# Patient Record
Sex: Male | Born: 1975 | Race: White | Hispanic: No | Marital: Married | Smoking: Never smoker
Health system: Southern US, Community
[De-identification: ages and names within clinical notes are randomized; demographics above are authoritative.]

## PROBLEM LIST (undated history)

## (undated) DIAGNOSIS — G43909 Migraine, unspecified, not intractable, without status migrainosus: Secondary | ICD-10-CM

## (undated) DIAGNOSIS — F419 Anxiety disorder, unspecified: Secondary | ICD-10-CM

## (undated) DIAGNOSIS — E7800 Pure hypercholesterolemia, unspecified: Secondary | ICD-10-CM

## (undated) DIAGNOSIS — N200 Calculus of kidney: Secondary | ICD-10-CM

## (undated) HISTORY — PX: KNEE SURGERY: SHX244

---

## 1999-12-23 ENCOUNTER — Ambulatory Visit (HOSPITAL_COMMUNITY): Admission: RE | Admit: 1999-12-23 | Discharge: 1999-12-23 | Payer: Self-pay | Admitting: Family Medicine

## 1999-12-23 ENCOUNTER — Encounter: Payer: Self-pay | Admitting: Family Medicine

## 2000-01-31 ENCOUNTER — Encounter: Payer: Self-pay | Admitting: Family Medicine

## 2000-01-31 ENCOUNTER — Ambulatory Visit (HOSPITAL_COMMUNITY): Admission: RE | Admit: 2000-01-31 | Discharge: 2000-01-31 | Payer: Self-pay | Admitting: Family Medicine

## 2002-07-05 ENCOUNTER — Encounter: Payer: Self-pay | Admitting: Emergency Medicine

## 2002-07-05 ENCOUNTER — Emergency Department (HOSPITAL_COMMUNITY): Admission: EM | Admit: 2002-07-05 | Discharge: 2002-07-06 | Payer: Self-pay | Admitting: Emergency Medicine

## 2008-05-28 ENCOUNTER — Encounter: Admission: RE | Admit: 2008-05-28 | Discharge: 2008-05-28 | Payer: Self-pay | Admitting: Family Medicine

## 2014-07-30 ENCOUNTER — Emergency Department (HOSPITAL_BASED_OUTPATIENT_CLINIC_OR_DEPARTMENT_OTHER): Payer: BC Managed Care – PPO

## 2014-07-30 ENCOUNTER — Encounter (HOSPITAL_BASED_OUTPATIENT_CLINIC_OR_DEPARTMENT_OTHER): Payer: Self-pay | Admitting: *Deleted

## 2014-07-30 ENCOUNTER — Emergency Department (HOSPITAL_BASED_OUTPATIENT_CLINIC_OR_DEPARTMENT_OTHER)
Admission: EM | Admit: 2014-07-30 | Discharge: 2014-07-31 | Disposition: A | Discharge status: Home / Self Care | Payer: BC Managed Care – PPO | Attending: Emergency Medicine | Admitting: Emergency Medicine

## 2014-07-30 DIAGNOSIS — E780 Pure hypercholesterolemia: Secondary | ICD-10-CM | POA: Diagnosis not present

## 2014-07-30 DIAGNOSIS — Z8679 Personal history of other diseases of the circulatory system: Secondary | ICD-10-CM | POA: Diagnosis not present

## 2014-07-30 DIAGNOSIS — F419 Anxiety disorder, unspecified: Secondary | ICD-10-CM | POA: Diagnosis not present

## 2014-07-30 DIAGNOSIS — N200 Calculus of kidney: Secondary | ICD-10-CM

## 2014-07-30 DIAGNOSIS — R109 Unspecified abdominal pain: Secondary | ICD-10-CM | POA: Diagnosis present

## 2014-07-30 HISTORY — DX: Calculus of kidney: N20.0

## 2014-07-30 HISTORY — DX: Pure hypercholesterolemia, unspecified: E78.00

## 2014-07-30 HISTORY — DX: Anxiety disorder, unspecified: F41.9

## 2014-07-30 HISTORY — DX: Migraine, unspecified, not intractable, without status migrainosus: G43.909

## 2014-07-30 LAB — CBC WITH DIFFERENTIAL/PLATELET
BASOS ABS: 0.1 10*3/uL (ref 0.0–0.1)
Basophils Relative: 1 % (ref 0–1)
EOS ABS: 0.3 10*3/uL (ref 0.0–0.7)
EOS PCT: 3 % (ref 0–5)
HEMATOCRIT: 41.5 % (ref 39.0–52.0)
Hemoglobin: 13.9 g/dL (ref 13.0–17.0)
LYMPHS ABS: 2.9 10*3/uL (ref 0.7–4.0)
Lymphocytes Relative: 32 % (ref 12–46)
MCH: 29.4 pg (ref 26.0–34.0)
MCHC: 33.5 g/dL (ref 30.0–36.0)
MCV: 87.9 fL (ref 78.0–100.0)
MONOS PCT: 9 % (ref 3–12)
Monocytes Absolute: 0.8 10*3/uL (ref 0.1–1.0)
Neutro Abs: 5.2 10*3/uL (ref 1.7–7.7)
Neutrophils Relative %: 55 % (ref 43–77)
Platelets: 232 10*3/uL (ref 150–400)
RBC: 4.72 MIL/uL (ref 4.22–5.81)
RDW: 13.3 % (ref 11.5–15.5)
WBC: 9.2 10*3/uL (ref 4.0–10.5)

## 2014-07-30 LAB — BASIC METABOLIC PANEL
Anion gap: 10 (ref 5–15)
BUN: 22 mg/dL — ABNORMAL HIGH (ref 6–20)
CALCIUM: 9.3 mg/dL (ref 8.9–10.3)
CHLORIDE: 108 mmol/L (ref 101–111)
CO2: 21 mmol/L — ABNORMAL LOW (ref 22–32)
CREATININE: 1.62 mg/dL — AB (ref 0.61–1.24)
GFR, EST AFRICAN AMERICAN: 59 mL/min — AB (ref >60)
GFR, EST NON AFRICAN AMERICAN: 51 mL/min — AB (ref >60)
Glucose, Bld: 141 mg/dL — ABNORMAL HIGH (ref 65–99)
Potassium: 3.9 mmol/L (ref 3.5–5.1)
Sodium: 139 mmol/L (ref 135–145)

## 2014-07-30 LAB — URINALYSIS, ROUTINE W REFLEX MICROSCOPIC
BILIRUBIN URINE: NEGATIVE
Glucose, UA: NEGATIVE mg/dL
Ketones, ur: NEGATIVE mg/dL
Leukocytes, UA: NEGATIVE
Nitrite: NEGATIVE
Protein, ur: NEGATIVE mg/dL
Specific Gravity, Urine: 1.021 (ref 1.005–1.030)
UROBILINOGEN UA: 0.2 mg/dL (ref 0.0–1.0)
pH: 5 (ref 5.0–8.0)

## 2014-07-30 LAB — URINE MICROSCOPIC-ADD ON

## 2014-07-30 MED ORDER — ONDANSETRON 4 MG PO TBDP: 4.0000 mg | ORAL_TABLET | Freq: Three times a day (TID) | ORAL | Status: AC | PRN

## 2014-07-30 MED ORDER — TAMSULOSIN HCL 0.4 MG PO CAPS: 0.4000 mg | ORAL_CAPSULE | Freq: Every day | ORAL | Status: AC

## 2014-07-30 MED ORDER — OXYCODONE-ACETAMINOPHEN 5-325 MG PO TABS
1.0000 | ORAL_TABLET | Freq: Once | ORAL | Status: AC
  Administered 2014-07-31: 1 via ORAL
  Filled 2014-07-30: qty 1

## 2014-07-30 MED ORDER — OXYCODONE-ACETAMINOPHEN 5-325 MG PO TABS: 1.0000 | ORAL_TABLET | Freq: Four times a day (QID) | ORAL | Status: AC | PRN

## 2014-07-30 MED ORDER — FENTANYL CITRATE (PF) 100 MCG/2ML IJ SOLN
50.0000 ug | Freq: Once | INTRAMUSCULAR | Status: AC
  Administered 2014-07-30: 50 ug via INTRAVENOUS
  Filled 2014-07-30: qty 2

## 2014-07-30 MED ORDER — SODIUM CHLORIDE 0.9 % IV BOLUS (SEPSIS)
1000.0000 mL | INTRAVENOUS | Status: AC
  Administered 2014-07-30: 1000 mL via INTRAVENOUS

## 2014-07-30 MED ORDER — KETOROLAC TROMETHAMINE 30 MG/ML IJ SOLN
30.0000 mg | Freq: Once | INTRAMUSCULAR | Status: AC
  Administered 2014-07-30: 30 mg via INTRAVENOUS
  Filled 2014-07-30: qty 1

## 2014-07-30 MED ORDER — ONDANSETRON HCL 4 MG/2ML IJ SOLN
4.0000 mg | Freq: Once | INTRAMUSCULAR | Status: AC
  Administered 2014-07-30: 4 mg via INTRAVENOUS
  Filled 2014-07-30: qty 2

## 2014-07-30 MED ORDER — HYDROMORPHONE HCL 1 MG/ML IJ SOLN
1.0000 mg | Freq: Once | INTRAMUSCULAR | Status: AC
  Administered 2014-07-30: 1 mg via INTRAVENOUS
  Filled 2014-07-30: qty 1

## 2014-07-30 NOTE — ED Notes (Signed)
Patient transported to CT 

## 2014-07-30 NOTE — ED Notes (Signed)
Left flank pain since earlier today. Hx of kidney stones x 2.

## 2014-07-30 NOTE — ED Notes (Signed)
MD at bedside. 

## 2014-07-30 NOTE — ED Provider Notes (Signed)
CSN: [id number]     Arrival date & time 07/30/14  2029 History  This chart was scribed for Purvis Sheffield, MD by Doreatha Martin, ED Scribe. This patient was seen in room MH09/MH09 and the patient's care was started at 9:24 PM.      Chief Complaint  Patient presents with  . Flank Pain   Patient is a 39 y.o. male presenting with flank pain. The history is provided by the patient. No language interpreter was used.  Flank Pain This is a recurrent problem. The current episode started 3 to 5 hours ago. The problem occurs constantly. The problem has been gradually worsening. Pertinent negatives include no chest pain, no abdominal pain and no headaches. Nothing aggravates the symptoms. Nothing relieves the symptoms. He has tried nothing for the symptoms. The treatment provided no relief.    HPI Comments: Joseph Abbott is a 39 y.o. male with Hx of kidney stones who presents to the Emergency Department complaining of moderate, gradually worsening, sudden onset, left-sided flank 9/10 pain that radiates to the groin onset this afternoon. Pt reports associated diaphoresis, urgency, difficulty urinating, nausea, vomiting. Pt states that this pain feels similar to his previous episodes of renal calculi. Pt followed up with a urologist after his first episode of kidney stones 12 years ago. Pt states that his second episode was during the day and he was able to get a shot to relieve the pain in the office. NKDA. He denies fever, dysuria.   Past Medical History  Diagnosis Date  . Kidney stones   . Migraine   . High cholesterol   . Anxiety    Past Surgical History  Procedure Laterality Date  . Knee surgery     No family history on file. History  Substance Use Topics  . Smoking status: Never Smoker   . Smokeless tobacco: Not on file  . Alcohol Use: No    Review of Systems  Constitutional: Negative for appetite change and fatigue.  HENT: Negative for congestion, ear discharge and sinus pressure.    Eyes: Negative for discharge.  Respiratory: Negative for cough.   Cardiovascular: Negative for chest pain.  Gastrointestinal: Negative for abdominal pain and diarrhea.  Genitourinary: Positive for flank pain. Negative for frequency and hematuria.  Musculoskeletal: Negative for back pain.  Skin: Negative for rash.  Neurological: Negative for seizures and headaches.  Psychiatric/Behavioral: Negative for hallucinations.  All other systems reviewed and are negative.  Allergies  Review of patient's allergies indicates no known allergies.  Home Medications   Prior to Admission medications   Medication Sig Start Date End Date Taking? Authorizing Provider  ATENOLOL PO Take by mouth.   Yes Historical Provider, MD  Atorvastatin Calcium (LIPITOR PO) Take by mouth.   Yes Historical Provider, MD  Escitalopram Oxalate (LEXAPRO PO) Take by mouth.   Yes Historical Provider, MD  Topiramate (TOPAMAX PO) Take by mouth.   Yes Historical Provider, MD   BP 141/82 mmHg  Pulse 66  Temp(Src) 98.4 F (36.9 C) (Oral)  Resp 16  Ht  (1.854 m)  Wt 250 lb (113.399 kg)  BMI 32.99 kg/m2  SpO2 100% Physical Exam  Constitutional: He is oriented to person, place, and time. He appears well-developed and well-nourished. No distress.  HENT:  Head: Normocephalic and atraumatic.  Mouth/Throat: Oropharynx is clear and moist.  Eyes: Conjunctivae and EOM are normal. Pupils are equal, round, and reactive to light.  Neck: Normal range of motion. Neck supple. No tracheal deviation present.  Cardiovascular: Normal rate and normal heart sounds.  Exam reveals no gallop and no friction rub.   No murmur heard. Pulmonary/Chest: Breath sounds normal. No respiratory distress.  Abdominal: Soft. He exhibits no distension. There is no tenderness. There is no rebound and no guarding.  Musculoskeletal: Normal range of motion. He exhibits no edema or tenderness.  Neurological: He is alert and oriented to person, place, and  time.  Skin: Skin is warm and dry.  Psychiatric: He has a normal mood and affect. His behavior is normal.  Nursing note and vitals reviewed.   ED Course  Procedures (including critical care time) DIAGNOSTIC STUDIES: Oxygen Saturation is 100% on RA, normal by my interpretation.    COORDINATION OF CARE: 9:29 PM Discussed treatment plan with pt at bedside and pt agreed to plan.   Labs Review Labs Reviewed  URINALYSIS, ROUTINE W REFLEX MICROSCOPIC (NOT AT [facility]) - Abnormal; Notable for the following:    Hgb urine dipstick LARGE (*)    All other components within normal limits  BASIC METABOLIC PANEL - Abnormal; Notable for the following:    CO2 21 (*)    Glucose, Bld 141 (*)    BUN 22 (*)    Creatinine, Ser 1.62 (*)    GFR calc non Af Amer 51 (*)    GFR calc Af Amer 59 (*)    All other components within normal limits  URINE MICROSCOPIC-ADD ON  CBC WITH DIFFERENTIAL/PLATELET    Imaging Review Ct Renal Stone Study  07/30/2014   CLINICAL DATA:  Left flank pain, nausea and hematuria.  EXAM: CT ABDOMEN AND PELVIS WITHOUT CONTRAST  TECHNIQUE: Multidetector CT imaging of the abdomen and pelvis was performed following the standard protocol without IV contrast.  COMPARISON:  05/29/2014  FINDINGS: There is a 2 mm distal left ureteral calculus 1 cm from the ureterovesical junction with associated hydroureter and hydronephrosis. There also are at least two 2 mm calculi in the right lower pole renal collecting system.  There are unremarkable unenhanced appearances of the liver, spleen, pancreas and adrenals. The abdominal aorta is normal in caliber without atherosclerotic calcification. Bowel is remarkable only for uncomplicated colonic diverticulosis.  There is no significant abnormality in the lower chest. There is no significant musculoskeletal abnormality. Incidental note is made of a small fat containing umbilical hernia and small fat containing left inguinal hernia.  IMPRESSION: 1. Obstructing 2  mm distal left ureteral calculus, 1 cm from the UVJ. Moderate hydronephrosis. 2. Right nephrolithiasis 3. Diverticulosis 4. Small fat containing umbilical hernia 5. Small fat containing left inguinal hernia   Electronically Signed   By: Ellery Plunk M.D.   On: 07/30/2014 23:35     EKG Interpretation None      MDM   Final diagnoses:  Left flank pain  Nephrolithiasis    11:51 PM 39 y.o. male who presents with left flank pain consistent with previous kidney stones. Vital signs unremarkable here. Initially was not going to get imaging but his creatinine is mildly elevated so will get CT scan without contrast. Found to have 2 mm stone. Pain is controlled.  11:51 PM:  I have discussed the diagnosis/risks/treatment options with the patient and believe the pt to be eligible for discharge home to follow-up with his urologist. We also discussed returning to the ED immediately if new or worsening sx occur. We discussed the sx which are most concerning (e.g., worsening pain, fever) that necessitate immediate return. Medications administered to the patient during their visit and  any new prescriptions provided to the patient are listed below.  Medications given during this visit Medications  ondansetron (ZOFRAN) injection 4 mg (4 mg Intravenous Given 07/30/14 2126)  fentaNYL (SUBLIMAZE) injection 50 mcg (50 mcg Intravenous Given 07/30/14 2126)  sodium chloride 0.9 % bolus 1,000 mL (0 mLs Intravenous Stopped 07/30/14 2221)  ketorolac (TORADOL) 30 MG/ML injection 30 mg (30 mg Intravenous Given 07/30/14 2144)  HYDROmorphone (DILAUDID) injection 1 mg (1 mg Intravenous Given 07/30/14 2221)    New Prescriptions   ONDANSETRON (ZOFRAN ODT) 4 MG DISINTEGRATING TABLET    Take 1 tablet (4 mg total) by mouth every 8 (eight) hours as needed for nausea or vomiting.   OXYCODONE-ACETAMINOPHEN (PERCOCET) 5-325 MG PER TABLET    Take 1-2 tablets by mouth every 6 (six) hours as needed.   TAMSULOSIN (FLOMAX) 0.4 MG CAPS  CAPSULE    Take 1 capsule (0.4 mg total) by mouth daily after breakfast.       I personally performed the services described in this documentation, which was scribed in my presence. The recorded information has been reviewed and is accurate.    Purvis Sheffield, MD 07/30/14 2352

## 2016-02-20 IMAGING — CT CT RENAL STONE PROTOCOL
1 of 2 series · 15 of 32 positions shown, 19 images · non-contrast
Comparison: 05/29/2014

CLINICAL DATA: Left flank pain, nausea and hematuria.

EXAM:
CT ABDOMEN AND PELVIS WITHOUT CONTRAST
TECHNIQUE: Multidetector CT imaging of the abdomen and pelvis was performed
following the standard protocol without IV contrast.

[Series 2: renal stone > 200 lbs 5.0 b31f · axial · 0.82mm/px · z∈[+866,+1362]mm · 15 of 109 slices shown, 19 images]
[im 5/109  soft-tissue]
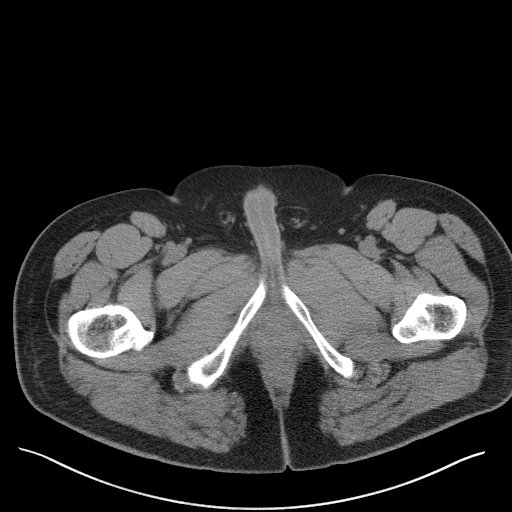
[im 5/109  bone]
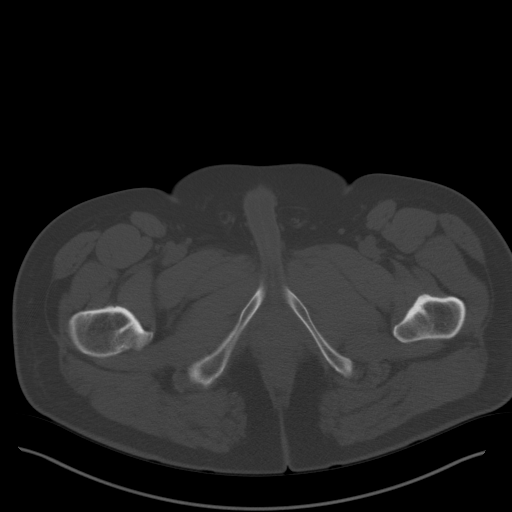
[im 15/109  soft-tissue]
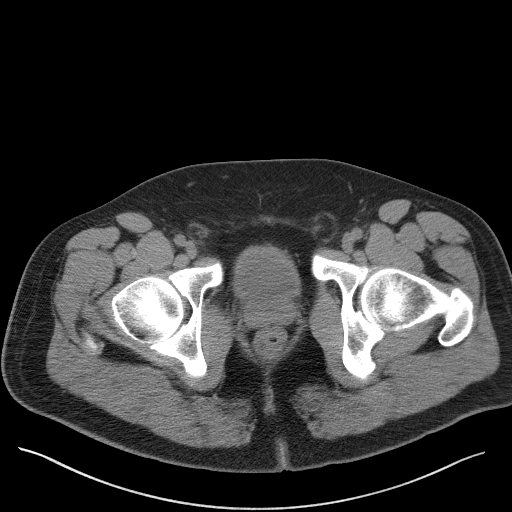
[im 24/109  soft-tissue]
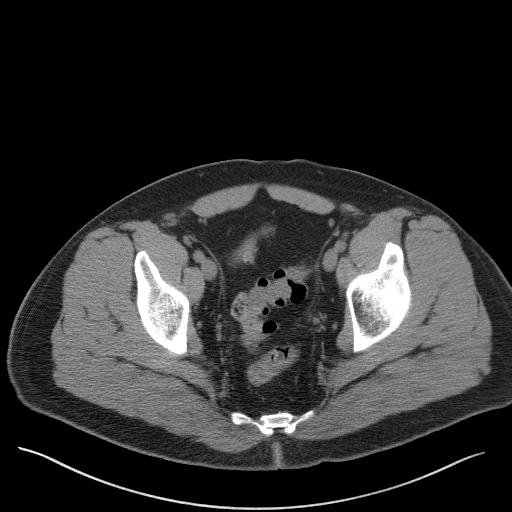
[im 29/109  soft-tissue]
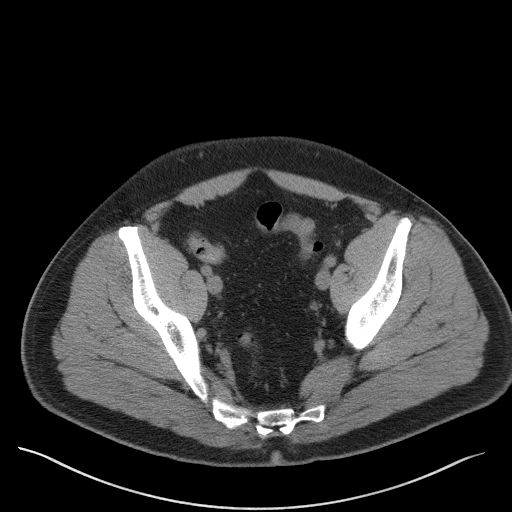
[im 38/109  soft-tissue]
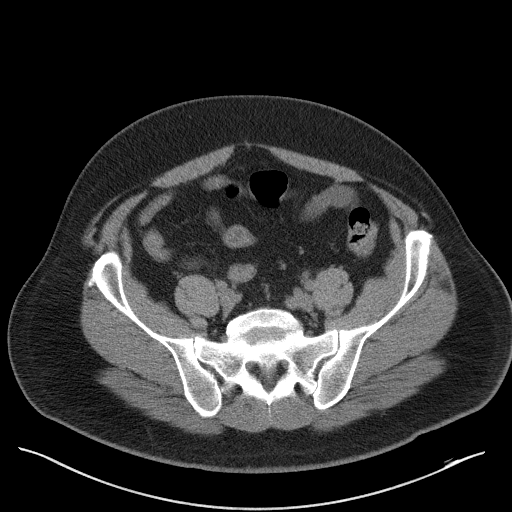
[im 47/109  soft-tissue]
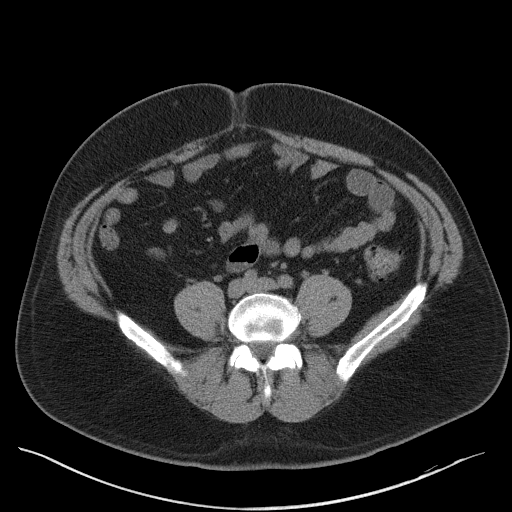
[im 57/109  soft-tissue]
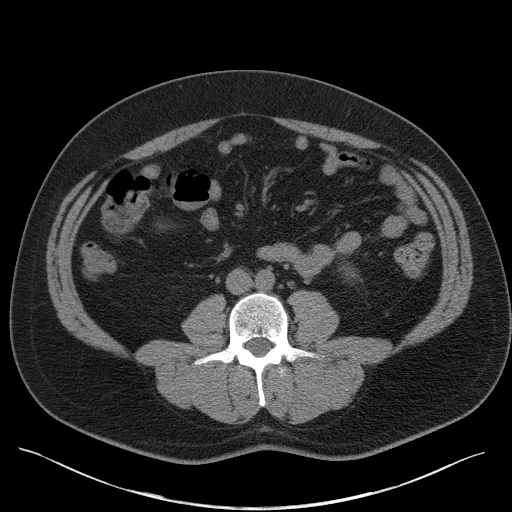
[im 62/109  soft-tissue]
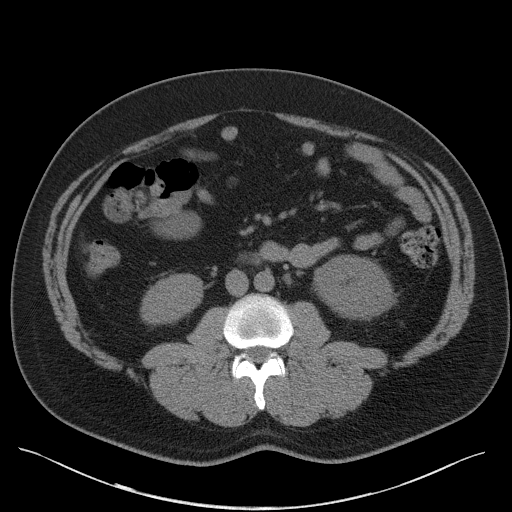
[im 71/109  soft-tissue]
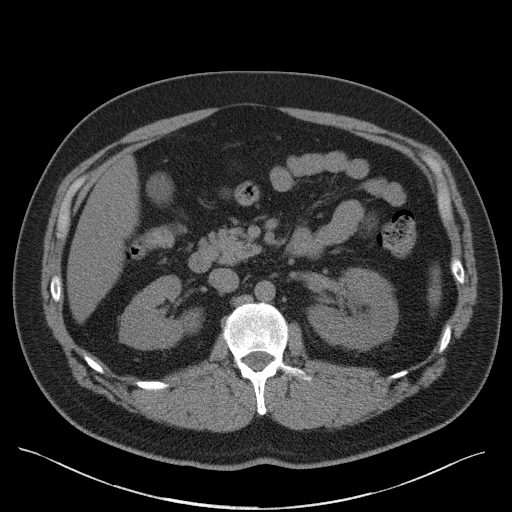
[im 71/109  bone]
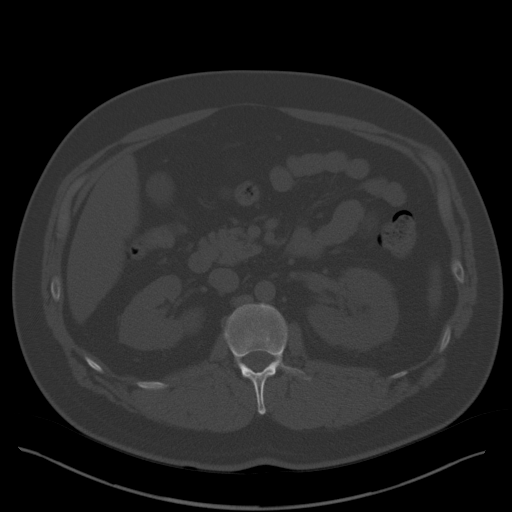
[im 80/109  soft-tissue]
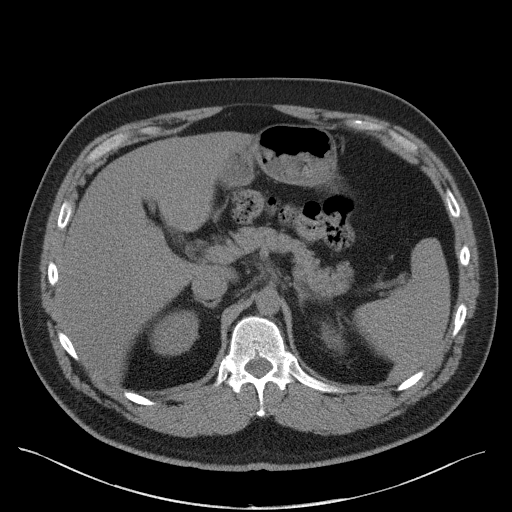
[im 85/109  soft-tissue]
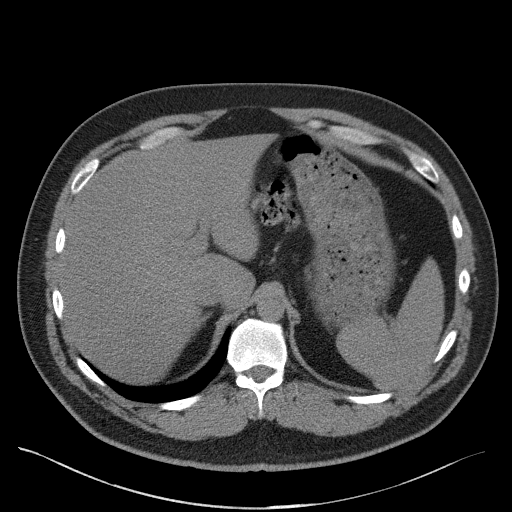
[im 90/109  lung]
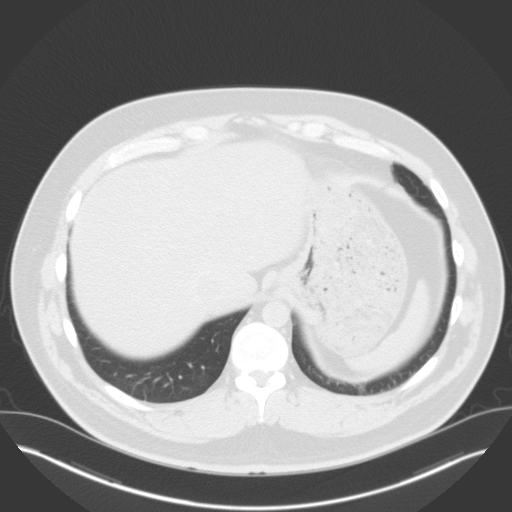
[im 94/109  soft-tissue]
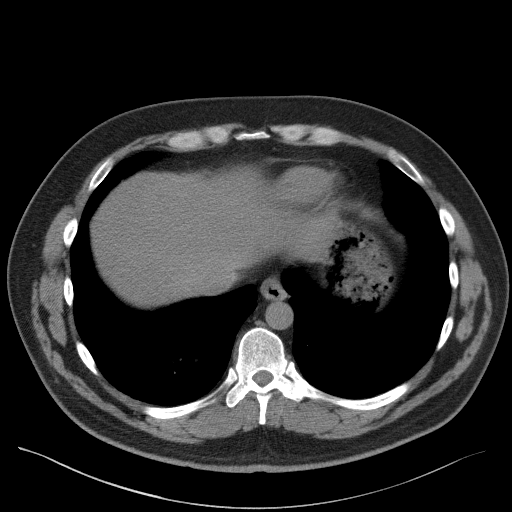
[im 94/109  lung]
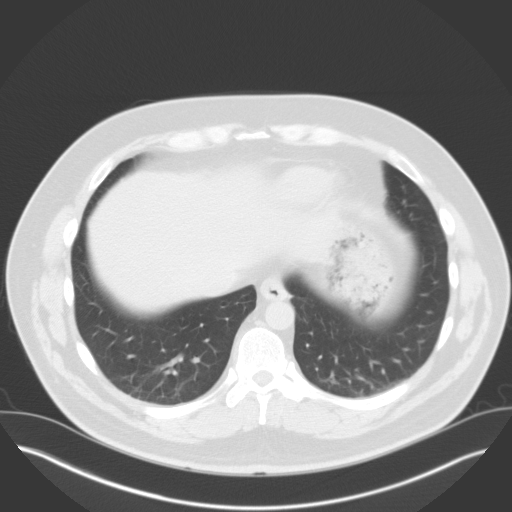
[im 99/109  lung]
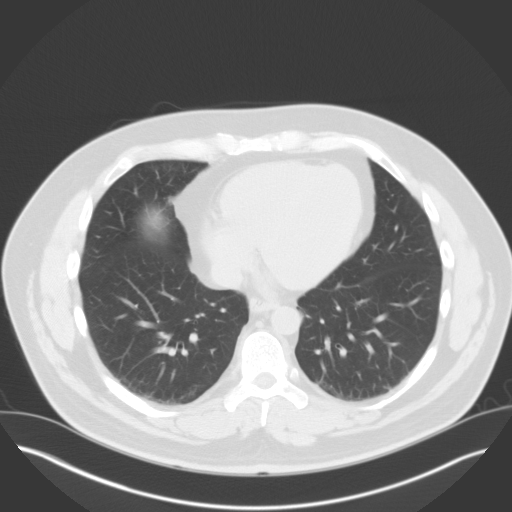
[im 104/109  soft-tissue]
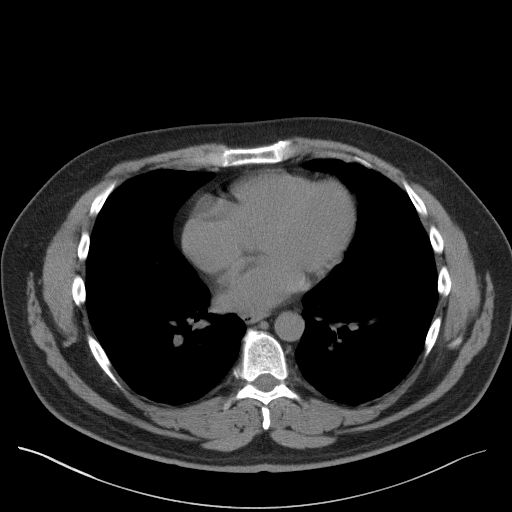
[im 104/109  lung]
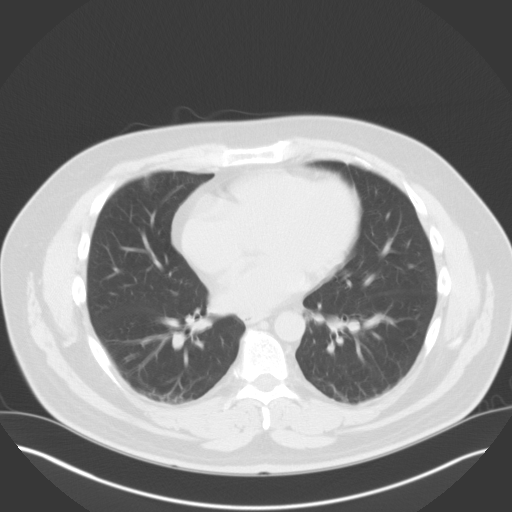

[15 of 32 positions shown; findings below may reference images not displayed]

FINDINGS: There is a 2 mm distal left ureteral calculus 1 cm from the
ureterovesical junction with associated hydroureter and
hydronephrosis. There also are at least two 2 mm calculi in the
right lower pole renal collecting system.

There are unremarkable unenhanced appearances of the liver, spleen,
pancreas and adrenals. The abdominal aorta is normal in caliber
without atherosclerotic calcification. Bowel is remarkable only for
uncomplicated colonic diverticulosis.

There is no significant abnormality in the lower chest. There is no
significant musculoskeletal abnormality. Incidental note is made of
a small fat containing umbilical hernia and small fat containing
left inguinal hernia.
IMPRESSION: 1. Obstructing 2 mm distal left ureteral calculus, 1 cm from the
UVJ. Moderate hydronephrosis.
2. Right nephrolithiasis
3. Diverticulosis
4. Small fat containing umbilical hernia
5. Small fat containing left inguinal hernia
